# Patient Record
Sex: Female | Born: 1960 | Race: White | Hispanic: No | Marital: Single | Smoking: Never smoker
Health system: Southern US, Community
[De-identification: ages and names within clinical notes are randomized; demographics above are authoritative.]

## PROBLEM LIST (undated history)

## (undated) HISTORY — PX: ABDOMINAL HYSTERECTOMY: SHX81

---

## 2011-11-01 ENCOUNTER — Ambulatory Visit: Payer: Self-pay | Admitting: Obstetrics and Gynecology

## 2013-02-26 ENCOUNTER — Ambulatory Visit: Payer: Self-pay | Admitting: Physician Assistant

## 2017-10-02 ENCOUNTER — Encounter: Payer: Self-pay | Admitting: Emergency Medicine

## 2017-10-02 ENCOUNTER — Ambulatory Visit
Admission: EM | Admit: 2017-10-02 | Discharge: 2017-10-02 | Disposition: A | Discharge status: Other Acute Inpatient Hospital | Payer: Self-pay

## 2017-10-02 ENCOUNTER — Emergency Department: Payer: Self-pay

## 2017-10-02 ENCOUNTER — Emergency Department
Admission: EM | Admit: 2017-10-02 | Discharge: 2017-10-02 | Disposition: A | Discharge status: Home / Self Care | Payer: Self-pay | Attending: Emergency Medicine | Admitting: Emergency Medicine

## 2017-10-02 DIAGNOSIS — M7541 Impingement syndrome of right shoulder: Secondary | ICD-10-CM | POA: Insufficient documentation

## 2017-10-02 MED ORDER — MELOXICAM 15 MG PO TABS: 15.0000 mg | ORAL_TABLET | Freq: Every day | ORAL | 0 refills | Status: AC

## 2017-10-02 NOTE — ED Notes (Signed)
Patient hurt her shoulder at work last night. States her right shoulder is sore.

## 2017-10-02 NOTE — ED Notes (Addendum)
Pt sent by UC for urine drug screen without chain of custody form. Per our system, company requires UDS and breath analysis only per request. No supervisor available to provide information. This RN contacted Vincenza HewsShane, Merchandiser, retailsupervisor for employee relations and was instructed that he spoke with HR representee, Holly and stated, "Jeanice LimHolly has said that if there is not suspected reason that no UDS or breath analysis is needed." Pt provided with this information.

## 2017-10-02 NOTE — ED Provider Notes (Signed)
[facility] Emergency Department Provider Note  ____________________________________________  Time seen: Approximately 9:13 PM  I have reviewed the triage vital signs and the nursing notes.   HISTORY  Chief Complaint Shoulder Injury    HPI Tracy Quinn is a 57 y.o. female who presents the emergency department complaining of right shoulder pain.  Patient reports that she works at [employer] and was lifting items onto a pallet yesterday.  Patient reports that she felt a popping sensation in her right shoulder and had immediately pain in the acromioclavicular joint space.  Patient reports that she has one specific range of motion that causes significant pain but is able to move her shoulder otherwise.  No history of previous shoulder injury.  Patient has taken some leftover pain medicine from a dental surgery and states that this helped somewhat but she continues to experience pain.  Given the patient's job requires continual lifting, she was afraid that she may do further damage to the area.  Patient denies any other injury or complaint.  No radiation of pain from the shoulder or numbness or tingling distally.    History reviewed. No pertinent past medical history.  There are no active problems to display for this patient.   History reviewed. No pertinent surgical history.  Prior to Admission medications   Medication Sig Start Date End Date Taking? Authorizing Provider  meloxicam (MOBIC) 15 MG tablet Take 1 tablet (15 mg total) by mouth daily. 10/02/17   Goro Wenrick, Delorise RoyalsJonathan D, PA-C    Allergies Patient has no known allergies.  No family history on file.  Social History Social History   Tobacco Use  . Smoking status: Never Smoker  . Smokeless tobacco: Never Used  Substance Use Topics  . Alcohol use: Yes  . Drug use: Not on file     Review of Systems  Constitutional: No fever/chills Eyes: No visual changes. No discharge ENT: No upper  respiratory complaints. Cardiovascular: no chest pain. Respiratory: no cough. No SOB. Gastrointestinal: No abdominal pain.  No nausea, no vomiting.  Musculoskeletal: Positive for right shoulder pain/injury Skin: Negative for rash, abrasions, lacerations, ecchymosis. Neurological: Negative for headaches, focal weakness or numbness. 10-point ROS otherwise negative.  ____________________________________________   PHYSICAL EXAM:  VITAL SIGNS: ED Triage Vitals [10/02/17 1923]  Enc Vitals Group     BP      Pulse      Resp      Temp      Temp src      SpO2      Weight 160 lb (72.6 kg)     Height 5\' 3"  (1.6 m)     Head Circumference      Peak Flow      Pain Score      Pain Loc      Pain Edu?      Excl. in GC?      Constitutional: Alert and oriented. Well appearing and in no acute distress. Eyes: Conjunctivae are normal. PERRL. EOMI. Head: Atraumatic.  Neck: No stridor.  No cervical spine tenderness to palpation.  Cardiovascular: Normal rate, regular rhythm. Normal S1 and S2.  Good peripheral circulation. Respiratory: Normal respiratory effort without tachypnea or retractions. Lungs CTAB. Good air entry to the bases with no decreased or absent breath sounds. Musculoskeletal: Full range of motion to all extremities. No gross deformities appreciated.  No gross deformity to the right shoulder.  Patient is able to extend, flex, supinate and pronate the shoulder without difficulty.  Patient is very minimally tender to palpation over the acromioclavicular joint space with no palpable abnormality or deficit.  Patient is nontender to palpation over the clavicle, scapula, rotator cuff distribution.  Negative empty can test.  Negative Yergason test.  Mildly positive speeds test.  Positive Hawkins impingement test.  Negative liftoff test.  Radial pulse intact distally.  Sensation in all digits distally. Neurologic:  Normal speech and language. No gross focal neurologic deficits are appreciated.   Skin:  Skin is warm, dry and intact. No rash noted. Psychiatric: Mood and affect are normal. Speech and behavior are normal. Patient exhibits appropriate insight and judgement.   ____________________________________________   LABS (all labs ordered are listed, but only abnormal results are displayed)  Labs Reviewed - No data to display ____________________________________________  EKG   ____________________________________________  RADIOLOGY I personally viewed and evaluated these images as part of my medical decision making, as well as reviewing the written report by the radiologist.  Dg Shoulder Right  Result Date: 10/02/2017 CLINICAL DATA:  Shoulder injury lifting at work October 01, 2017. EXAM: RIGHT SHOULDER - 2+ VIEW COMPARISON:  None. FINDINGS: The humeral head is well-formed and located. The subacromial, glenohumeral and acromioclavicular joint spaces are intact. No destructive bony lesions. Soft tissue planes are non-suspicious. IMPRESSION: Negative. Electronically Signed   By: Awilda Metro M.D.   On: 10/02/2017 19:56    ____________________________________________    PROCEDURES  Procedure(s) performed:    Procedures    Medications - No data to display   ____________________________________________   INITIAL IMPRESSION / ASSESSMENT AND PLAN / ED COURSE  Pertinent labs & imaging results that were available during my care of the patient were reviewed by me and considered in my medical decision making (see chart for details).  Review of the [place] CSRS was performed in accordance of the [facility] prior to dispensing any controlled drugs.      Patient's diagnosis is consistent with impingement syndrome of the shoulder.  Patient presents emergency department right shoulder pain after lifting an item yesterday.  Patient had positive Hawkins test, mildly positive Yergason's.  Given findings, differential included impingement syndrome, separated shoulder, biceps  tendinitis, and much less likely rotator cuff tendinitis/tear, or labral tear.  Given findings, patient is advised to follow-up with orthopedics after trial of anti-inflammatories.  Patient will be given sling for comfort but is instructed to perform range of motion to the right shoulder to prevent adhesive capsulitis.. Patient will be discharged home with prescriptions for meloxicam. Patient is to follow up with orthopedics as needed or otherwise directed. Patient is given ED precautions to return to the ED for any worsening or new symptoms.     ____________________________________________  FINAL CLINICAL IMPRESSION(S) / ED DIAGNOSES  Final diagnoses:  Impingement syndrome of right shoulder      NEW MEDICATIONS STARTED DURING THIS VISIT:  ED Discharge Orders        Ordered    meloxicam (MOBIC) 15 MG tablet  Daily     10/02/17 2134          This chart was dictated using voice recognition software/Dragon. Despite best efforts to proofread, errors can occur which can change the meaning. Any change was purely unintentional.    Racheal Patches, PA-C 10/02/17 2135    Minna Antis, MD 10/02/17 2204

## 2017-10-02 NOTE — ED Triage Notes (Signed)
Pt reports she felt her right shoulder tear while picking up a box at work. Injury happened at approx. 1730 on Monday 8/5. No obvious deformity noted.

## 2017-10-02 NOTE — ED Notes (Signed)
Director of health and safety, Rudy JewChristopher Cole, [phone]. Contact for issues regarding workers comp. Per Genia DelShane, Christopher advises that if company did not send request, UDS and other testing is not required. Pt let known of conversation.

## 2017-10-05 ENCOUNTER — Ambulatory Visit
Admission: EM | Admit: 2017-10-05 | Discharge: 2017-10-05 | Disposition: A | Discharge status: Home / Self Care | Payer: Self-pay | Attending: Family Medicine | Admitting: Family Medicine

## 2017-10-05 ENCOUNTER — Other Ambulatory Visit: Payer: Self-pay

## 2017-10-05 DIAGNOSIS — M25511 Pain in right shoulder: Secondary | ICD-10-CM

## 2017-10-05 NOTE — Discharge Instructions (Addendum)
Continue medication as discussed. Stretch. Proper lifting.   Follow up with orthopedic as needed.  Follow up with your primary care physician this week as needed. Return to Urgent care for new or worsening concerns.

## 2017-10-05 NOTE — ED Provider Notes (Signed)
[facility] ____________________________________________  Time seen: Approximately 12:35 PM  I have reviewed the triage vital signs and the nursing notes.   HISTORY  Chief Complaint Shoulder Pain   HPI Tracy Quinn is a 57 y.o. female presenting for follow-up of right shoulder pain post injury that occurred 10/02/2017 while at work.  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  Patient reports that she was lifting approximately 50 pound box up over her head, primarily with her right hand, and states during this activity she felt immediate onset of pain to her right shoulder.  Described pain as a burning pain and pain with range of motion.  States she was seen in the ER that day and had a negative x-ray and was started on oral Mobic.  States that she was also given a note restricting her to no lifting greater than 25 pounds.  States the last few days she has been off of work, has rested the shoulder, taken Mobic and been doing pendulum exercises as directed.  States that she is feeling much better.  States currently she has no pain.  States she only ever has minimal pain with certain movements but again states that she is feeling well.  Patient here today requesting clearance for work.  States that she returns to work on Sunday and would like to return to work without restrictions.  Denies pain radiation, paresthesias, other aggravating or alleviating factors.  Reports otherwise feels well.  Denies chest pain, shortness of breath, abdominal pain, or rash. Denies recent sickness. Denies recent antibiotic use.    History reviewed. No pertinent past medical history.  There are no active problems to display for this patient.   Past Surgical History:  Procedure Laterality Date  . ABDOMINAL HYSTERECTOMY    . CESAREAN SECTION     x 2     No current facility-administered medications for this encounter.   Current Outpatient Medications:  .  meloxicam (MOBIC) 15 MG  tablet, Take 1 tablet (15 mg total) by mouth daily., Disp: 30 tablet, Rfl: 0  Allergies Patient has no known allergies.  Family History  Problem Relation Age of Onset  . Heart disease Mother     Social History Social History   Tobacco Use  . Smoking status: Never Smoker  . Smokeless tobacco: Never Used  Substance Use Topics  . Alcohol use: Yes  . Drug use: Never    Review of Systems Constitutional: No fever/chills Cardiovascular: Denies chest pain. Respiratory: Denies shortness of breath. Musculoskeletal: Negative for back pain. As above.  Skin: Negative for rash.   ____________________________________________   PHYSICAL EXAM:  VITAL SIGNS: ED Triage Vitals  Enc Vitals Group     BP 10/05/17 1205 140/82     Pulse Rate 10/05/17 1205 62     Resp 10/05/17 1205 18     Temp 10/05/17 1205 98.6 F (37 C)     Temp Source 10/05/17 1205 Oral     SpO2 10/05/17 1205 99 %     Weight 10/05/17 1203 160 lb (72.6 kg)     Height 10/05/17 1203 5\' 3"  (1.6 m)     Head Circumference --      Peak Flow --      Pain Score 10/05/17 1202 0     Pain Loc --      Pain Edu? --      Excl. in GC? --     Constitutional: Alert and oriented. Well appearing and in no acute  distress. ENT      Head: Normocephalic and atraumatic. Cardiovascular: Normal rate, regular rhythm. Grossly normal heart sounds.  Good peripheral circulation. Respiratory: Normal respiratory effort without tachypnea nor retractions. Breath sounds are clear and equal bilaterally. No wheezes, rales, rhonchi. Musculoskeletal:  No midline cervical, thoracic or lumbar tenderness to palpation. Bilateral distal radial pulses equal and easily palpated. Right shoulder full range of motion present, able to abduct greater than 90 degrees, negative empty can test, negative Hawkins impingement test, negative drop arm, no pain elicited on exam, no point bony tenderness, no swelling, no ecchymosis, right upper extremity otherwise  nontender. Neurologic:  Normal speech and language.  Speech is normal. No gait instability.  Skin:  Skin is warm, dry. Psychiatric: Mood and affect are normal. Speech and behavior are normal. Patient exhibits appropriate insight and judgment   ___________________________________________   LABS (all labs ordered are listed, but only abnormal results are displayed)  Labs Reviewed - No data to display ____________________________________________  RADIOLOGY  Reviewed x-ray from 10/02/2016 from ER of right shoulder. EXAM: RIGHT SHOULDER - 2+ VIEW  COMPARISON:  None.  FINDINGS: The humeral head is well-formed and located. The subacromial, glenohumeral and acromioclavicular joint spaces are intact. No destructive bony lesions. Soft tissue planes are non-suspicious.  IMPRESSION: Negative.   Electronically Signed   By: Awilda Metro M.D.   On: 10/02/2017 19:56  PROCEDURES Procedures   INITIAL IMPRESSION / ASSESSMENT AND PLAN / ED COURSE  Pertinent labs & imaging results that were available during my care of the patient were reviewed by me and considered in my medical decision making (see chart for details).  Well-appearing patient.  No acute distress.  Patient for follow-up of right shoulder pain.  Patient states she is significantly improved and doing well.  No pain elicited on exam and patient requests clearance to return to work.  Clearance to return to work without restrictions given, however cautioned to use proper body mechanics, continue Mobic, continue stretches.  Discussed strict follow-up and reevaluation including follow-up with orthopedic for any return of pain.   Discussed follow up and return parameters including no resolution or any worsening concerns. Patient verbalized understanding and agreed to plan.   ____________________________________________   FINAL CLINICAL IMPRESSION(S) / ED DIAGNOSES  Final diagnoses:  Right shoulder pain, unspecified  chronicity     ED Discharge Orders    None       Note: This dictation was prepared with Dragon dictation along with smaller phrase technology. Any transcriptional errors that result from this process are unintentional.         Renford Dills, NP 10/05/17 1241

## 2017-10-05 NOTE — ED Triage Notes (Signed)
Patient states that she seen in ED for right shoulder impingement. Patient states that pain has since stopped. She states that she has tried to return to work but they will not allow her without a note saying she can return to work.

## 2018-01-09 ENCOUNTER — Other Ambulatory Visit: Payer: Self-pay

## 2018-01-09 ENCOUNTER — Ambulatory Visit (INDEPENDENT_AMBULATORY_CARE_PROVIDER_SITE_OTHER): Payer: BLUE CROSS/BLUE SHIELD

## 2018-01-09 ENCOUNTER — Ambulatory Visit
Admission: EM | Admit: 2018-01-09 | Discharge: 2018-01-09 | Disposition: A | Discharge status: Home / Self Care | Payer: BLUE CROSS/BLUE SHIELD | Attending: Family Medicine | Admitting: Family Medicine

## 2018-01-09 DIAGNOSIS — W000XXA Fall on same level due to ice and snow, initial encounter: Secondary | ICD-10-CM | POA: Diagnosis not present

## 2018-01-09 DIAGNOSIS — Y92009 Unspecified place in unspecified non-institutional (private) residence as the place of occurrence of the external cause: Secondary | ICD-10-CM | POA: Diagnosis not present

## 2018-01-09 DIAGNOSIS — S20212A Contusion of left front wall of thorax, initial encounter: Secondary | ICD-10-CM

## 2018-01-09 NOTE — ED Provider Notes (Signed)
[facility]    CSN: [id number] Arrival date & time: 01/09/18  [id number]     History   Chief Complaint Chief Complaint  Patient presents with  . Fall  . Back Pain    HPI Tracy Quinn is a 57 y.o. female.   57 yo female with a c/o left sided chest wall pain, left rib pain since slipping on ice and falling this morning at home.   The history is provided by the patient.  Fall   Back Pain    History reviewed. No pertinent past medical history.  There are no active problems to display for this patient.   Past Surgical History:  Procedure Laterality Date  . ABDOMINAL HYSTERECTOMY    . CESAREAN SECTION     x 2    OB History   None      Home Medications    Prior to Admission medications   Medication Sig Start Date End Date Taking? Authorizing Provider  meloxicam (MOBIC) 15 MG tablet Take 1 tablet (15 mg total) by mouth daily. 10/02/17   Cuthriell, Delorise RoyalsJonathan D, PA-C    Family History Family History  Problem Relation Age of Onset  . Heart disease Mother     Social History Social History   Tobacco Use  . Smoking status: Never Smoker  . Smokeless tobacco: Never Used  Substance Use Topics  . Alcohol use: Yes    Comment: occasionally  . Drug use: Never     Allergies   Latex   Review of Systems Review of Systems  Musculoskeletal: Positive for back pain.     Physical Exam Triage Vital Signs ED Triage Vitals  Enc Vitals Group     BP 01/09/18 0840 124/74     Pulse Rate 01/09/18 0840 78     Resp 01/09/18 0840 18     Temp 01/09/18 0840 98.3 F (36.8 C)     Temp Source 01/09/18 0840 Oral     SpO2 01/09/18 0840 98 %     Weight 01/09/18 0837 149 lb (67.6 kg)     Height 01/09/18 0837 5\' 3"  (1.6 m)     Head Circumference --      Peak Flow --      Pain Score 01/09/18 0837 5     Pain Loc --      Pain Edu? --      Excl. in GC? --    No data found.  Updated Vital Signs BP 124/74 (BP Location: Left Arm)   Pulse 78   Temp 98.3 F (36.8  C) (Oral)   Resp 18   Ht 5\' 3"  (1.6 m)   Wt 67.6 kg   SpO2 98%   BMI 26.39 kg/m   Visual Acuity Right Eye Distance:   Left Eye Distance:   Bilateral Distance:    Right Eye Near:   Left Eye Near:    Bilateral Near:     Physical Exam  Constitutional: She appears well-developed and well-nourished. No distress.  Cardiovascular: Normal rate, regular rhythm and normal heart sounds.  Pulmonary/Chest: Effort normal and breath sounds normal. No stridor. No respiratory distress. She has no wheezes. She has no rales. She exhibits tenderness (left lower rib tenderness and overlying mild soft tissue edema).  Skin: She is not diaphoretic.  Nursing note and vitals reviewed.    UC Treatments / Results  Labs (all labs ordered are listed, but only abnormal results are displayed) Labs Reviewed - No data to display  EKG  None  Radiology Dg Ribs Unilateral W/chest Left  Result Date: 01/09/2018 CLINICAL DATA:  Fall with left rib pain.  Initial encounter. EXAM: LEFT RIBS AND CHEST - 3+ VIEW COMPARISON:  None. FINDINGS: No fracture or other bone lesions are seen involving the ribs. There is no evidence of pneumothorax or pleural effusion. Both lungs are clear. Heart size and mediastinal contours are within normal limits. Probable left nephrolithiasis measuring 2 mm. IMPRESSION: Negative for rib fracture or intrathoracic injury. Probable 2 mm left renal calculus. Electronically Signed   By: Marnee Spring M.D.   On: 01/09/2018 09:17    Procedures Procedures (including critical care time)  Medications Ordered in UC Medications - No data to display  Initial Impression / Assessment and Plan / UC Course  I have reviewed the triage vital signs and the nursing notes.  Pertinent labs & imaging results that were available during my care of the patient were reviewed by me and considered in my medical decision making (see chart for details).      Final Clinical Impressions(s) / UC Diagnoses    Final diagnoses:  Rib contusion, left, initial encounter     Discharge Instructions     Rest, ice, over the counter anti-inflammatories    ED Prescriptions    None     1. x-ray results and diagnosis reviewed with patient 2. Recommend supportive treatment as above 3. Follow-up prn if symptoms worsen or don't improve   Controlled Substance Prescriptions [place] Controlled Substance Registry consulted? Not Applicable   Payton Mccallum, MD 01/09/18 [phone]

## 2018-01-09 NOTE — ED Triage Notes (Signed)
Patient states that she fell at the steps at her home this morning. Patient states that she didn't realized that there was ice on her bottom steps and fell on her back. Patient states that back of rib is hurting and hurts when she takes a deep breath.

## 2018-01-09 NOTE — Discharge Instructions (Signed)
Rest, ice, over the counter anti-inflammatories °

## 2019-03-25 ENCOUNTER — Ambulatory Visit: Payer: BLUE CROSS/BLUE SHIELD | Attending: Internal Medicine

## 2019-03-25 DIAGNOSIS — Z20822 Contact with and (suspected) exposure to covid-19: Secondary | ICD-10-CM

## 2019-03-26 LAB — NOVEL CORONAVIRUS, NAA: SARS-CoV-2, NAA: NOT DETECTED

## 2019-12-16 IMAGING — CR DG RIBS W/ CHEST 3+V*L*
5 series · 6 of 6 positions shown · non-contrast
Comparison: None.

CLINICAL DATA: Fall with left rib pain.  Initial encounter.

EXAM:
LEFT RIBS AND CHEST - 3+ VIEW

[Series 1: chest pa · 0.14mm/px · 2 of 2 slices shown]
[im 1/2]
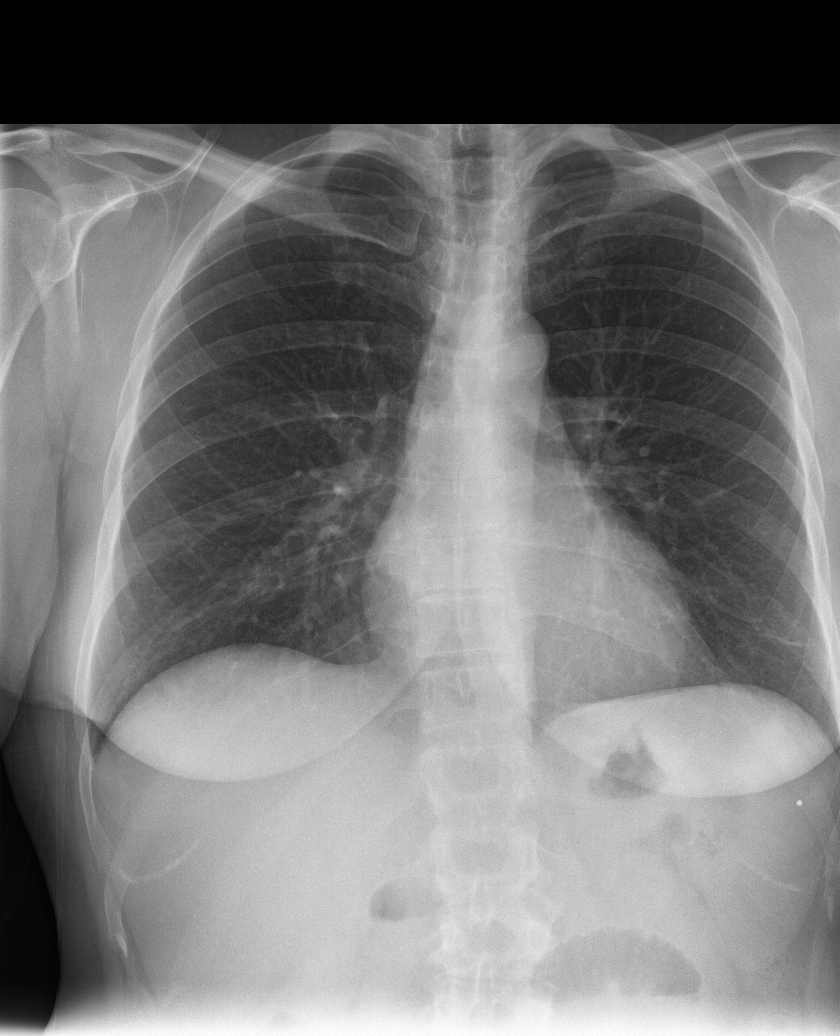
[im 2/2]
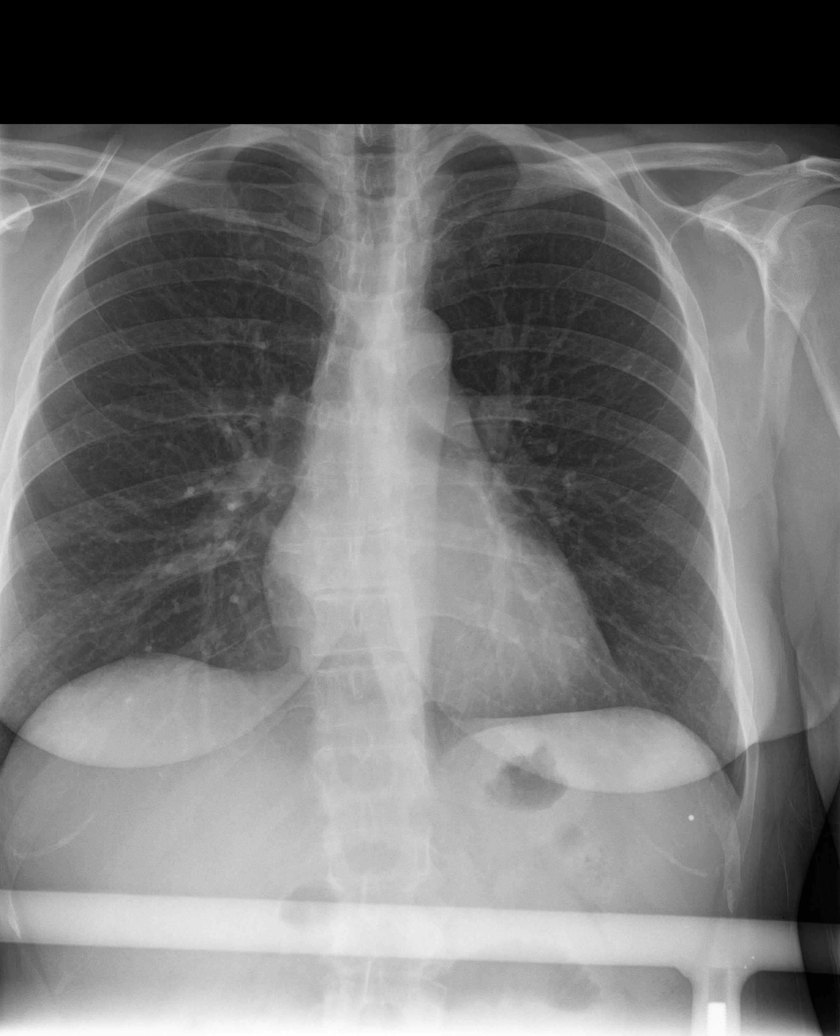

[rib pa (1 of 2)]
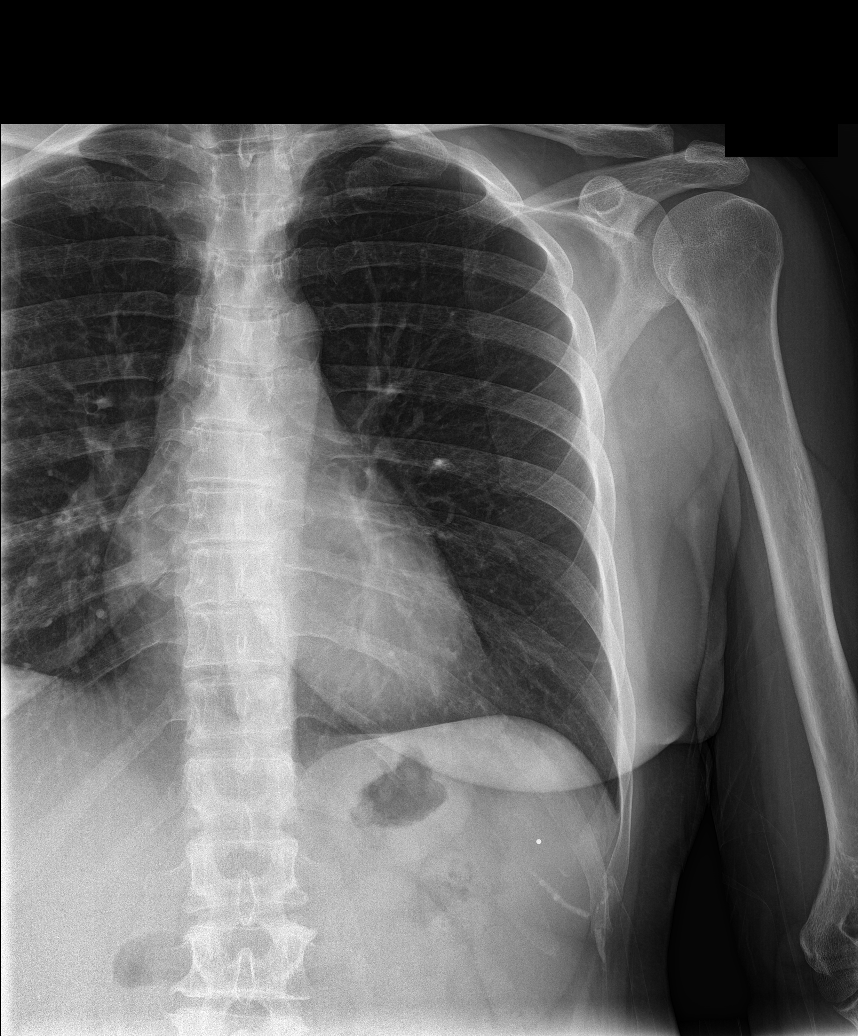

[rib pa (2 of 2)]
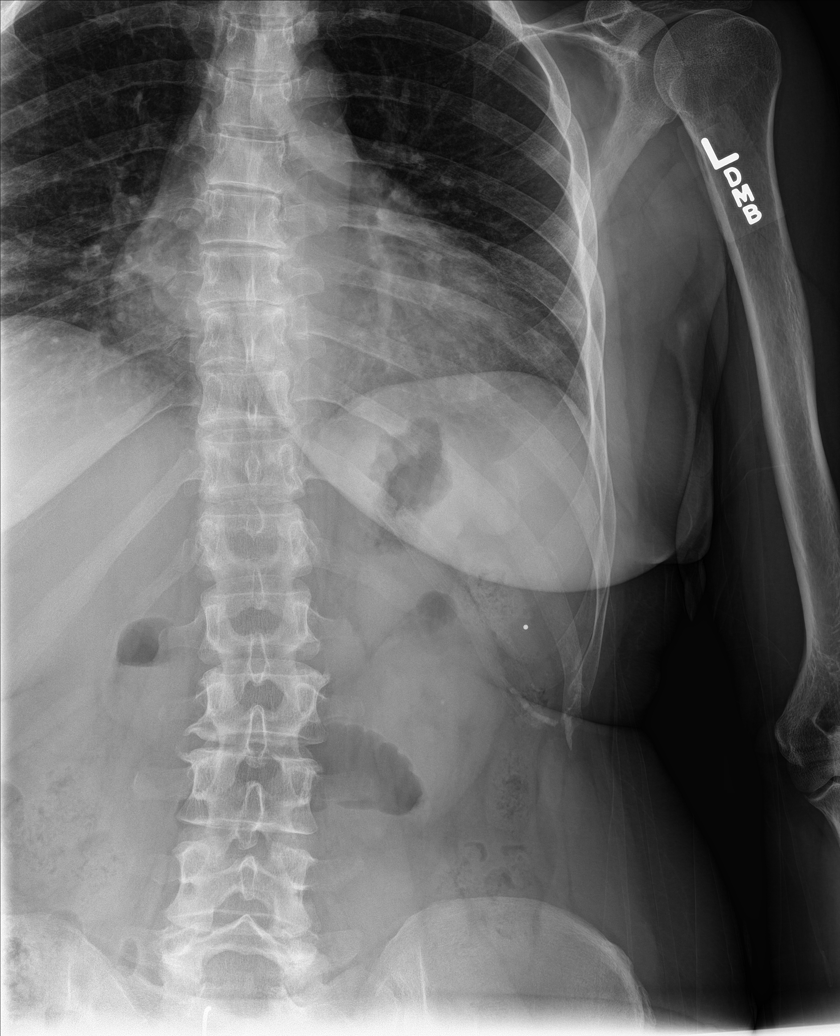

[rib obl (1 of 2)]
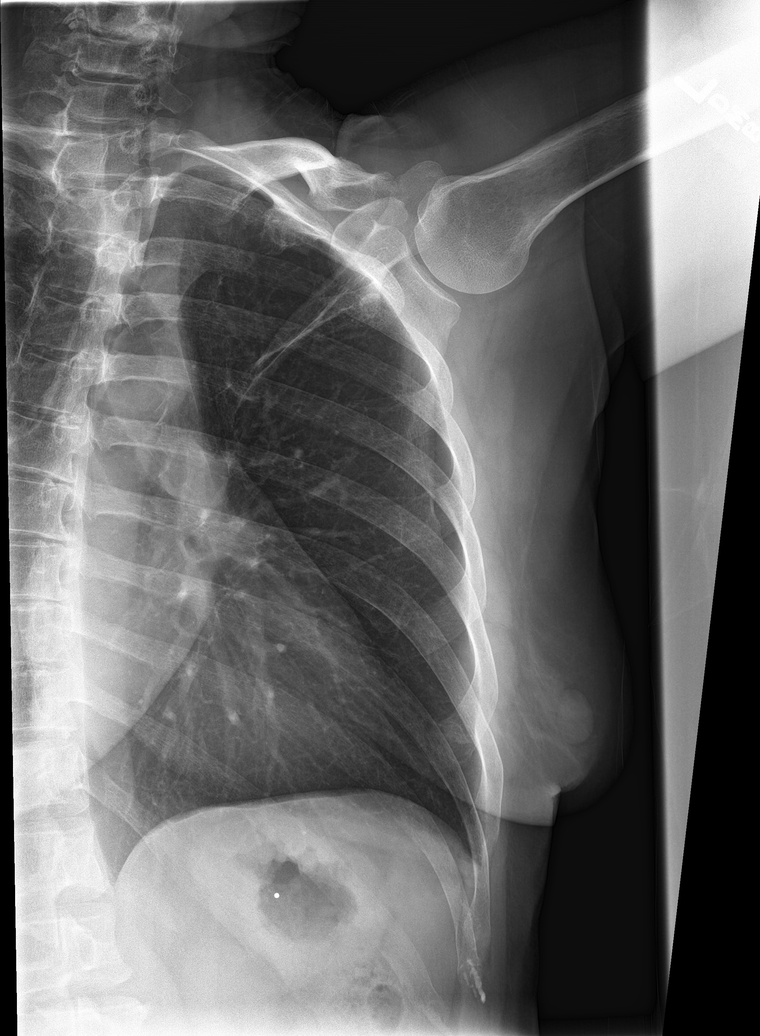

[rib obl (2 of 2)]
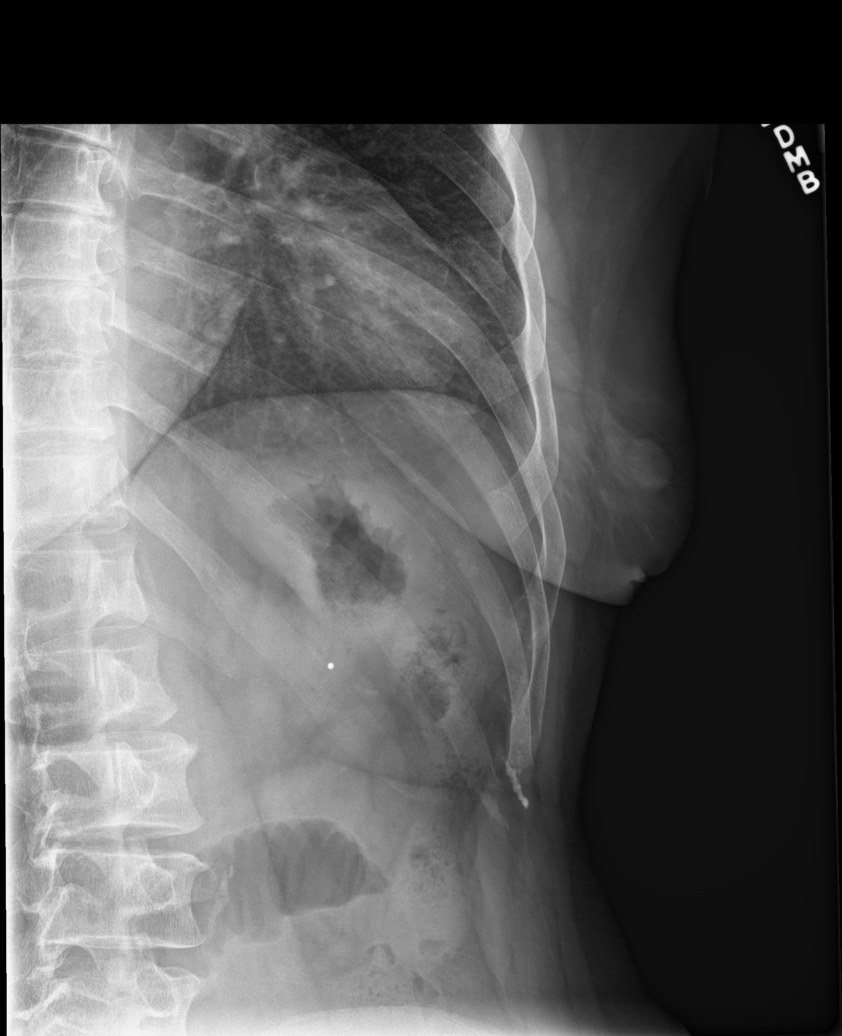

[6 of 6 positions shown; findings below may reference images not displayed]

FINDINGS: No fracture or other bone lesions are seen involving the ribs. There
is no evidence of pneumothorax or pleural effusion. Both lungs are
clear. Heart size and mediastinal contours are within normal limits.

Probable left nephrolithiasis measuring 2 mm.
IMPRESSION: Negative for rib fracture or intrathoracic injury.

Probable 2 mm left renal calculus.
